# Patient Record
Sex: Male | Born: 1990 | Race: White | Hispanic: No | Marital: Single
Health system: Southern US, Community
[De-identification: ages and names within clinical notes are randomized; demographics above are authoritative.]

---

## 2011-12-28 ENCOUNTER — Other Ambulatory Visit: Payer: Self-pay | Admitting: Student

## 2011-12-28 ENCOUNTER — Ambulatory Visit
Admission: RE | Admit: 2011-12-28 | Discharge: 2011-12-28 | Disposition: A | Discharge status: Home / Self Care | Payer: No Typology Code available for payment source | Source: Ambulatory Visit | Attending: Student | Admitting: Student

## 2011-12-28 DIAGNOSIS — Z9289 Personal history of other medical treatment: Secondary | ICD-10-CM

## 2014-05-01 ENCOUNTER — Other Ambulatory Visit: Payer: Self-pay | Admitting: *Deleted

## 2014-05-01 ENCOUNTER — Ambulatory Visit
Admission: RE | Admit: 2014-05-01 | Discharge: 2014-05-01 | Disposition: A | Discharge status: Home / Self Care | Payer: No Typology Code available for payment source | Source: Ambulatory Visit | Attending: *Deleted | Admitting: *Deleted

## 2014-05-01 DIAGNOSIS — R7611 Nonspecific reaction to tuberculin skin test without active tuberculosis: Secondary | ICD-10-CM

## 2014-10-05 DEATH — deceased

## 2015-02-28 IMAGING — CR DG CHEST 1V
1 series · 1 of 1 positions shown · non-contrast
Comparison: 12/28/2011

CLINICAL DATA: Positive PPD

EXAM:
CHEST - 1 VIEW

[w chest pa]
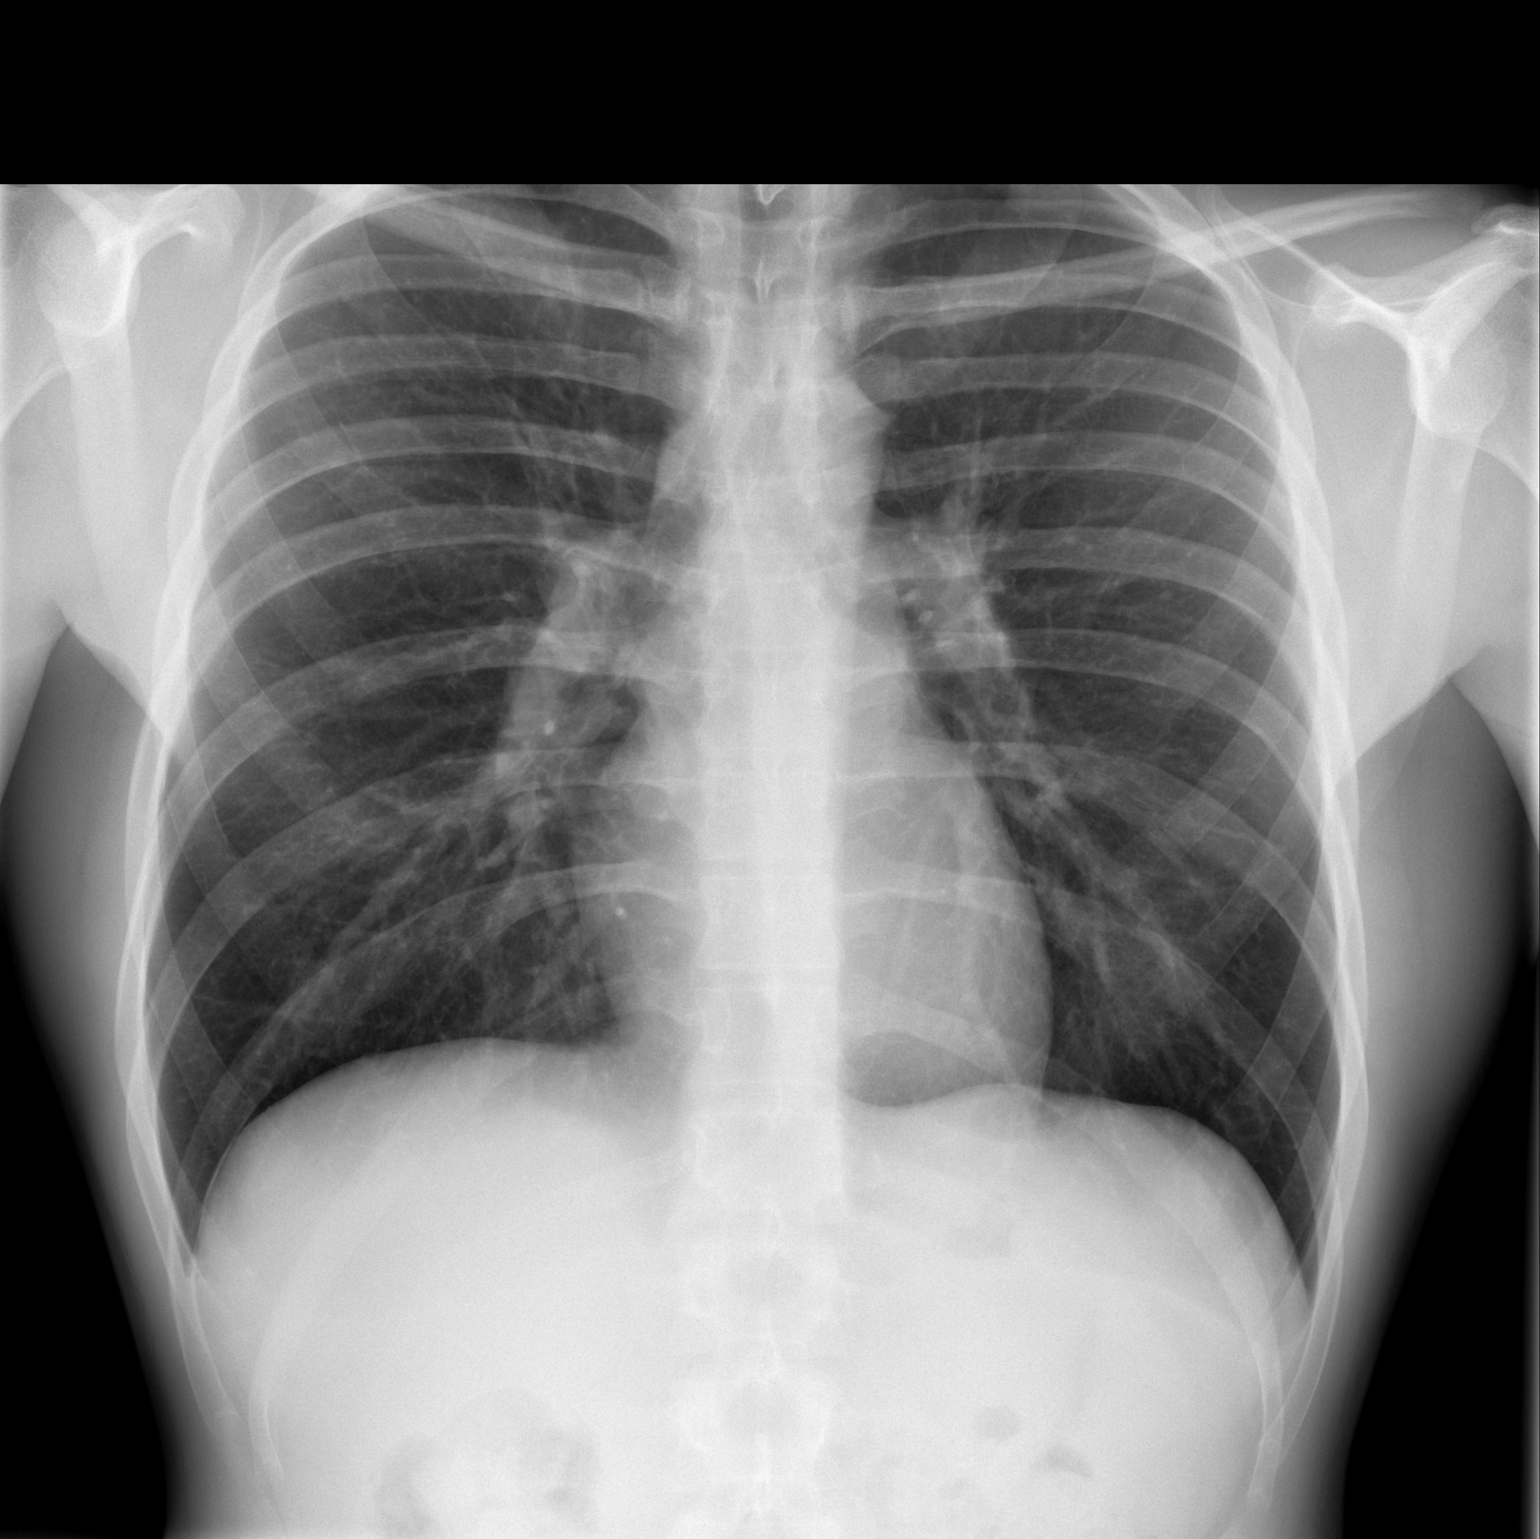

[1 of 1 positions shown; findings below may reference images not displayed]

FINDINGS: The heart size and mediastinal contours are within normal limits.
Both lungs are clear. The visualized skeletal structures are
unremarkable.
IMPRESSION: No active disease.
